# Patient Record
Sex: Female | Born: 1960 | Race: White | Hispanic: No | Marital: Married | Smoking: Never smoker
Health system: Southern US, Community
[De-identification: ages and names within clinical notes are randomized; demographics above are authoritative.]

## PROBLEM LIST (undated history)

## (undated) DIAGNOSIS — E079 Disorder of thyroid, unspecified: Secondary | ICD-10-CM

## (undated) DIAGNOSIS — F329 Major depressive disorder, single episode, unspecified: Secondary | ICD-10-CM

## (undated) DIAGNOSIS — F32A Depression, unspecified: Secondary | ICD-10-CM

---

## 1998-02-01 ENCOUNTER — Other Ambulatory Visit: Admission: RE | Admit: 1998-02-01 | Discharge: 1998-02-01 | Payer: Self-pay | Admitting: Obstetrics and Gynecology

## 1998-02-09 ENCOUNTER — Other Ambulatory Visit: Admission: RE | Admit: 1998-02-09 | Discharge: 1998-02-09 | Payer: Self-pay | Admitting: Obstetrics and Gynecology

## 1998-03-15 ENCOUNTER — Other Ambulatory Visit: Admission: RE | Admit: 1998-03-15 | Discharge: 1998-03-15 | Payer: Self-pay | Admitting: Obstetrics and Gynecology

## 1998-08-17 ENCOUNTER — Inpatient Hospital Stay (HOSPITAL_COMMUNITY): Admission: AD | Admit: 1998-08-17 | Discharge: 1998-08-19 | Payer: Self-pay | Admitting: Obstetrics and Gynecology

## 1998-09-28 ENCOUNTER — Other Ambulatory Visit: Admission: RE | Admit: 1998-09-28 | Discharge: 1998-09-28 | Payer: Self-pay | Admitting: Obstetrics and Gynecology

## 1999-10-28 ENCOUNTER — Other Ambulatory Visit: Admission: RE | Admit: 1999-10-28 | Discharge: 1999-10-28 | Payer: Self-pay | Admitting: Obstetrics and Gynecology

## 2004-03-01 ENCOUNTER — Other Ambulatory Visit: Admission: RE | Admit: 2004-03-01 | Discharge: 2004-03-01 | Payer: Self-pay | Admitting: Obstetrics and Gynecology

## 2004-08-18 HISTORY — PX: BREAST EXCISIONAL BIOPSY: SUR124

## 2005-03-17 ENCOUNTER — Other Ambulatory Visit: Admission: RE | Admit: 2005-03-17 | Discharge: 2005-03-17 | Payer: Self-pay | Admitting: Obstetrics and Gynecology

## 2005-12-20 ENCOUNTER — Emergency Department: Payer: Self-pay | Admitting: Unknown Physician Specialty

## 2005-12-20 ENCOUNTER — Other Ambulatory Visit: Payer: Self-pay

## 2006-03-03 ENCOUNTER — Encounter: Admission: RE | Admit: 2006-03-03 | Discharge: 2006-03-03 | Payer: Self-pay | Admitting: Obstetrics and Gynecology

## 2006-04-16 ENCOUNTER — Other Ambulatory Visit: Admission: RE | Admit: 2006-04-16 | Discharge: 2006-04-16 | Payer: Self-pay | Admitting: Obstetrics and Gynecology

## 2006-05-04 ENCOUNTER — Encounter (INDEPENDENT_AMBULATORY_CARE_PROVIDER_SITE_OTHER): Payer: Self-pay | Admitting: *Deleted

## 2006-05-04 ENCOUNTER — Ambulatory Visit (HOSPITAL_BASED_OUTPATIENT_CLINIC_OR_DEPARTMENT_OTHER): Admission: RE | Admit: 2006-05-04 | Discharge: 2006-05-04 | Payer: Self-pay | Admitting: Surgery

## 2006-06-07 ENCOUNTER — Other Ambulatory Visit: Payer: Self-pay

## 2006-06-07 ENCOUNTER — Emergency Department: Payer: Self-pay | Admitting: Emergency Medicine

## 2006-06-12 ENCOUNTER — Ambulatory Visit: Payer: Self-pay | Admitting: Family Medicine

## 2006-07-03 ENCOUNTER — Other Ambulatory Visit: Payer: Self-pay

## 2006-07-03 ENCOUNTER — Ambulatory Visit: Payer: Self-pay | Admitting: General Surgery

## 2007-04-23 ENCOUNTER — Other Ambulatory Visit: Admission: RE | Admit: 2007-04-23 | Discharge: 2007-04-23 | Payer: Self-pay | Admitting: Obstetrics and Gynecology

## 2007-04-28 ENCOUNTER — Encounter: Admission: RE | Admit: 2007-04-28 | Discharge: 2007-04-28 | Payer: Self-pay | Admitting: Obstetrics and Gynecology

## 2007-10-30 IMAGING — CR DG CHEST 1V PORT
1 series · 1 of 1 positions shown · non-contrast
Comparison: none

REASON FOR EXAM: Chest Pain
COMMENTS:  LMP: Now

PROCEDURE:     DXR - DXR PORTABLE CHEST SINGLE VIEW  - June 07, 2006  [DATE]
RESULT:     AP view of the chest shows the lung fields to be clear. The
heart, mediastinal and osseous structures reveal no significant
abnormalities.

[view not recorded]
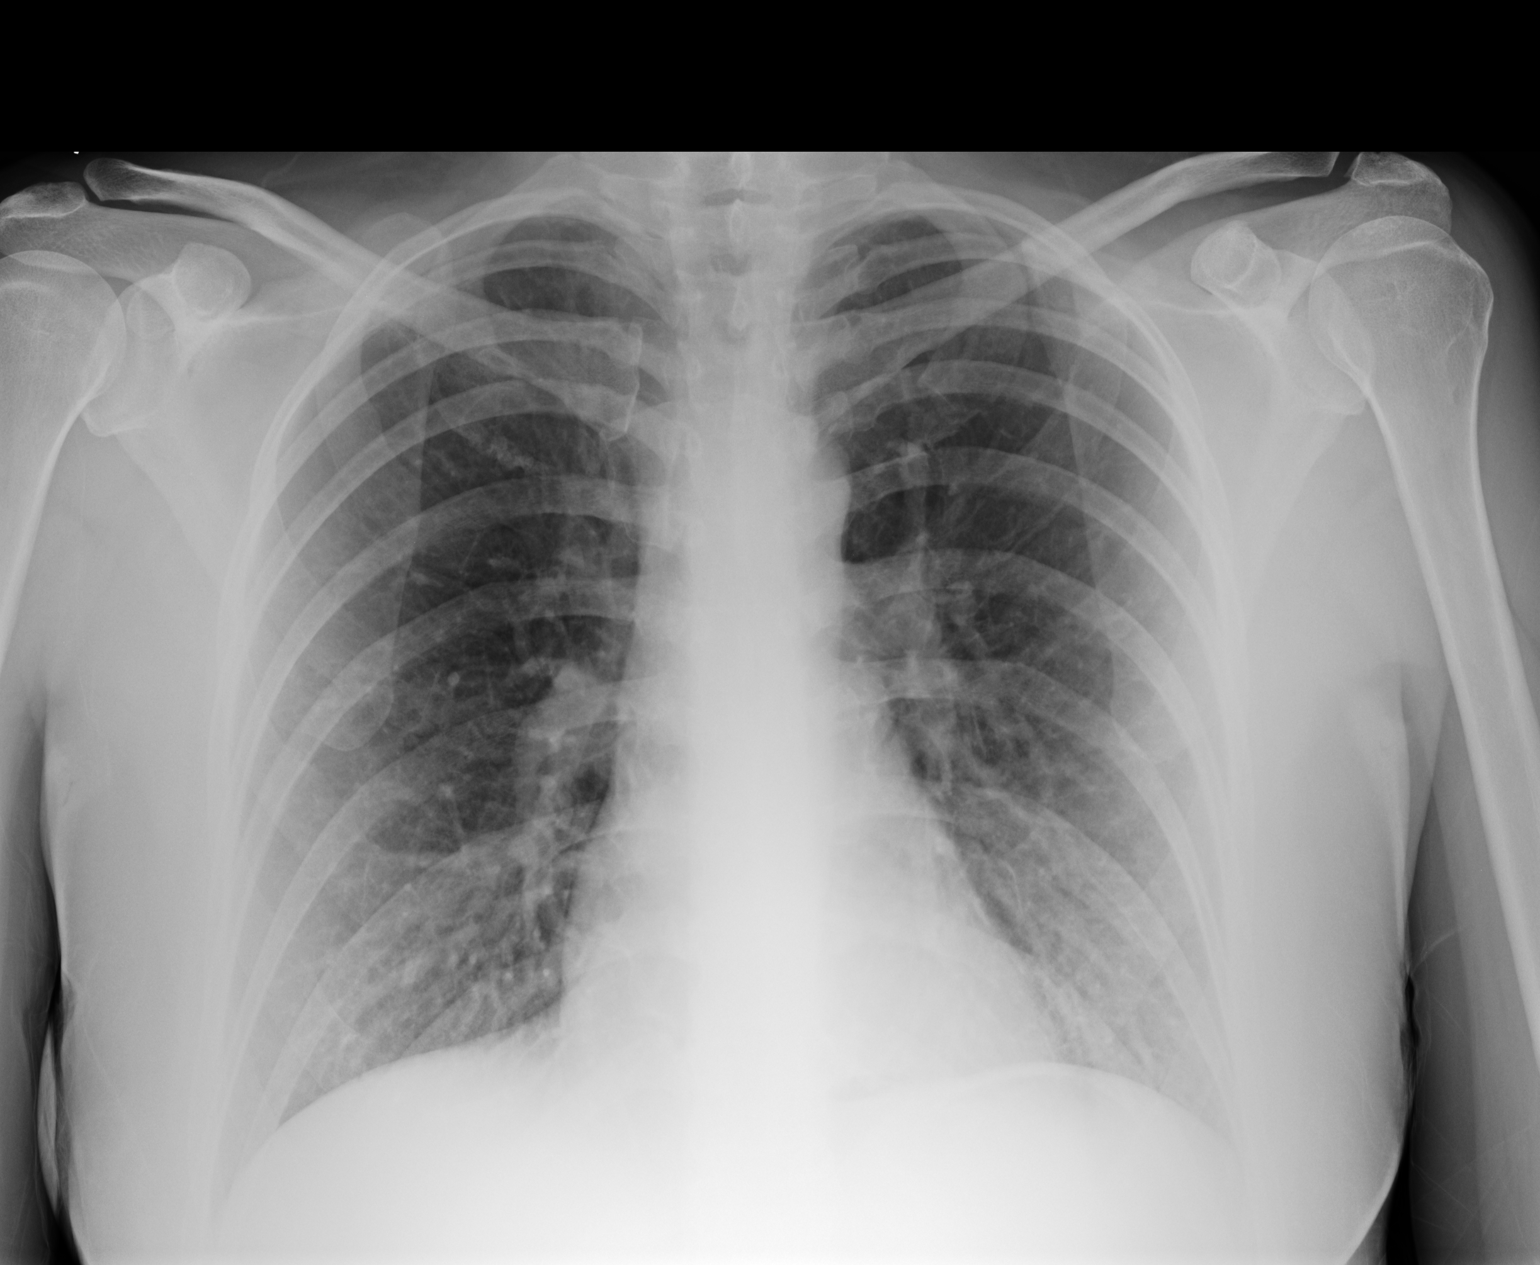

[1 of 1 positions shown; findings below may reference images not displayed]

IMPRESSION: 1)No acute changes are identified.

## 2007-11-04 IMAGING — US ABDOMEN ULTRASOUND
1 series · 17 of 25 positions shown · non-contrast
Comparison: none

REASON FOR EXAM: Abdominal pain
COMMENTS:

[Series 1: abdomen ultrasound · 17 of 53 slices shown]
[im 1/53]
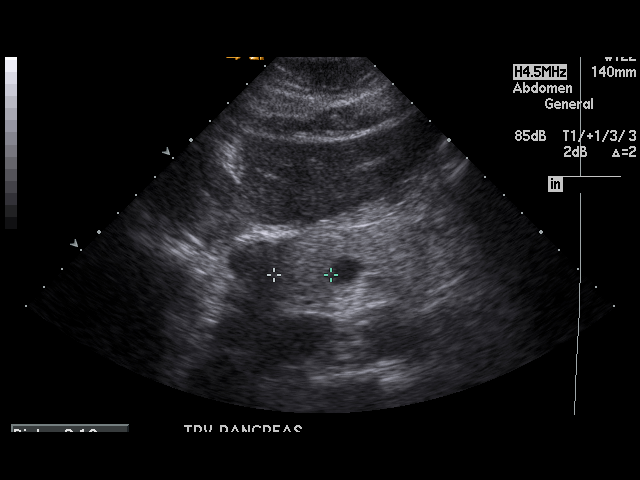
[im 5/53]
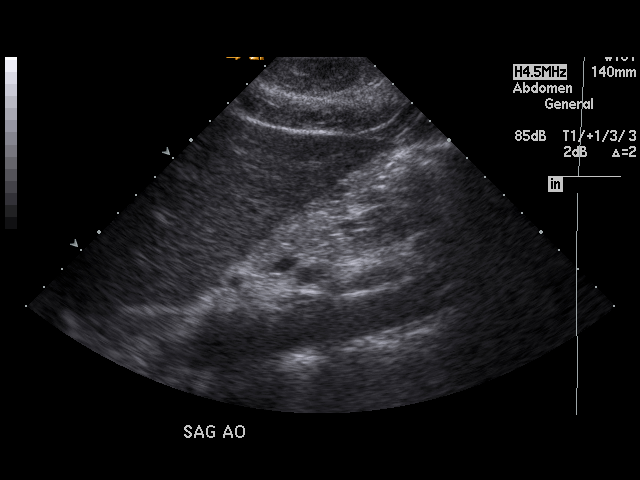
[im 7/53]
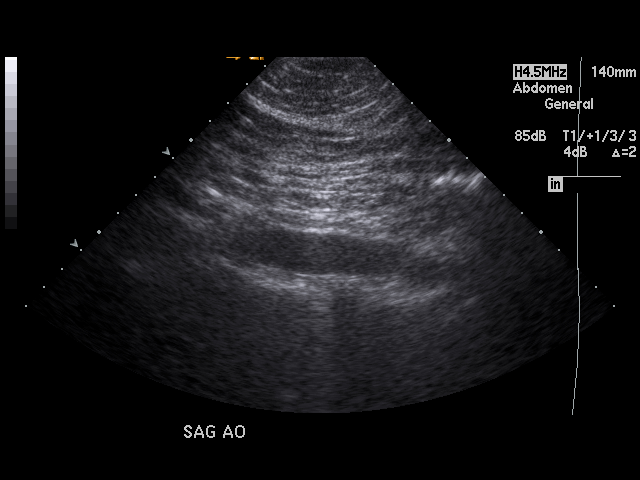
[im 11/53]
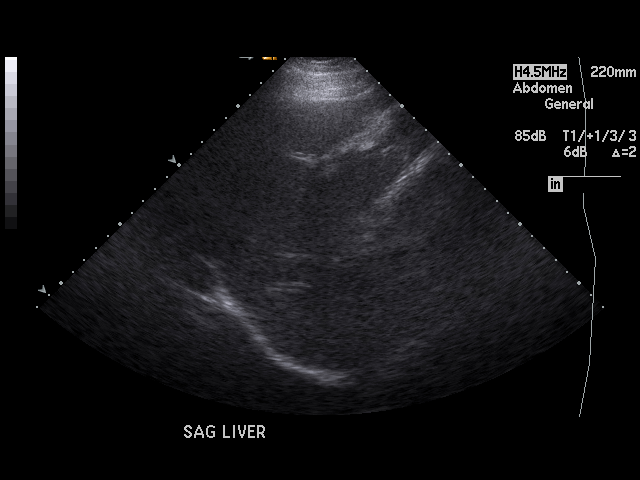
[im 14/53]
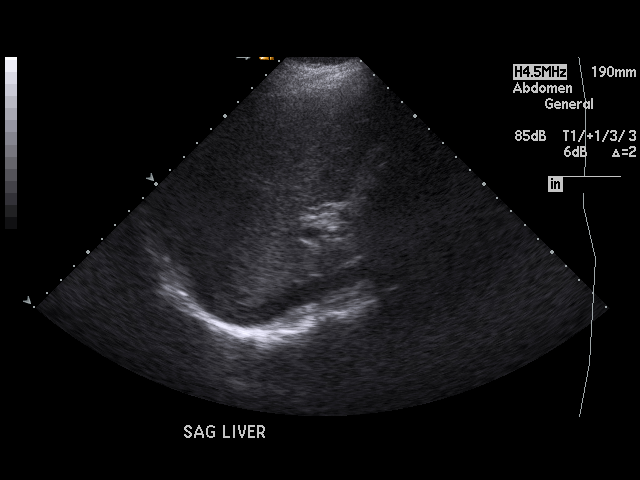
[im 18/53]
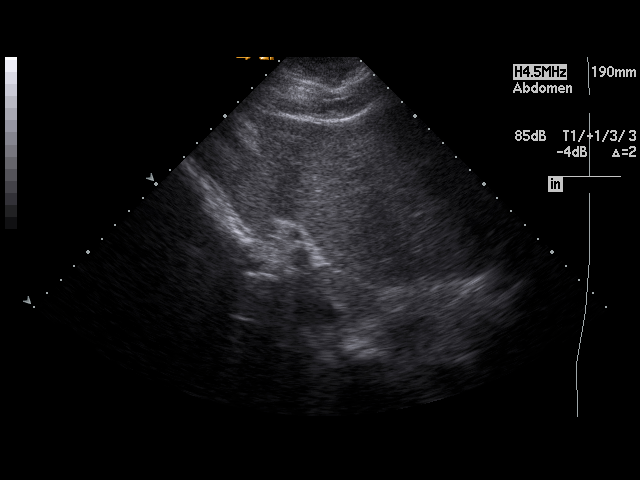
[im 20/53]
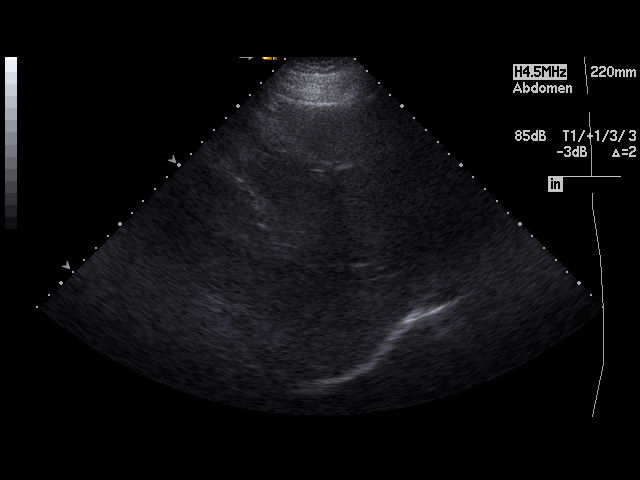
[im 24/53]
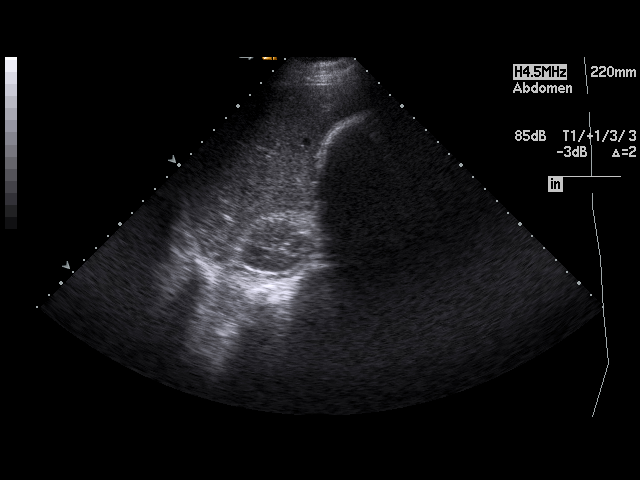
[im 27/53]
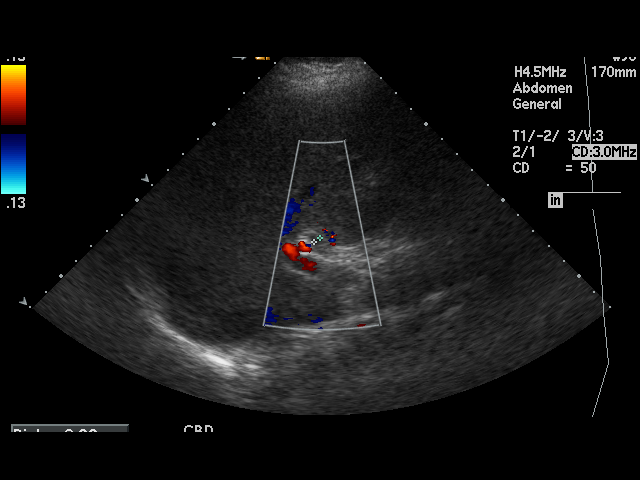
[im 29/53]
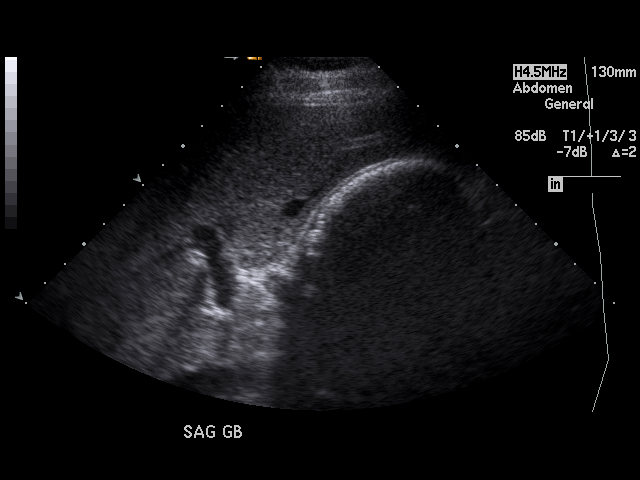
[im 33/53]
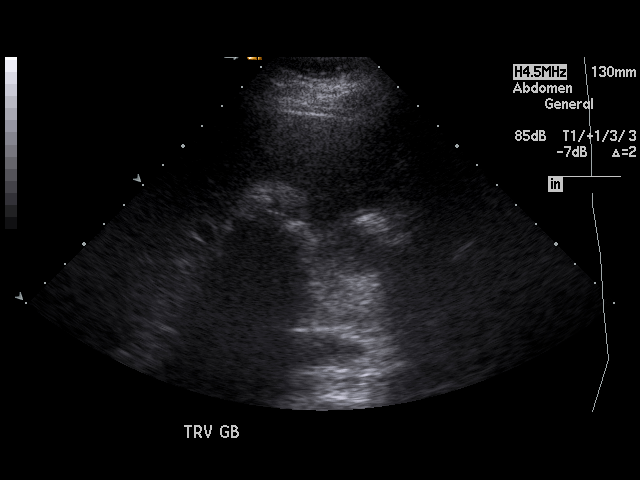
[im 35/53]
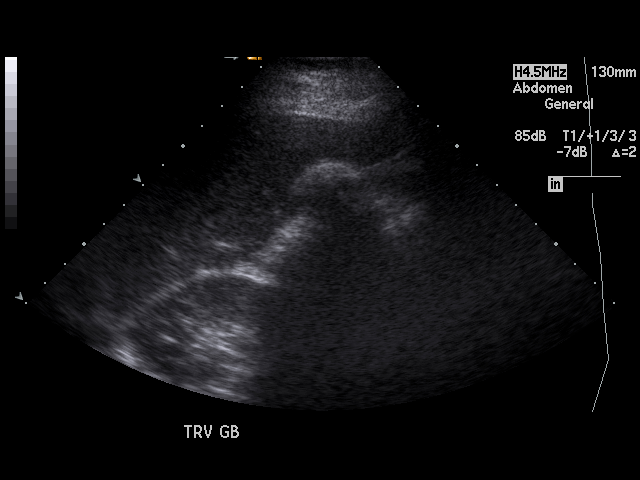
[im 40/53]
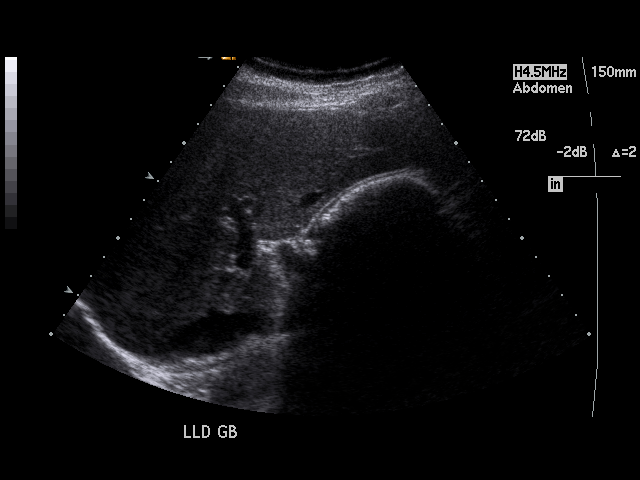
[im 42/53]
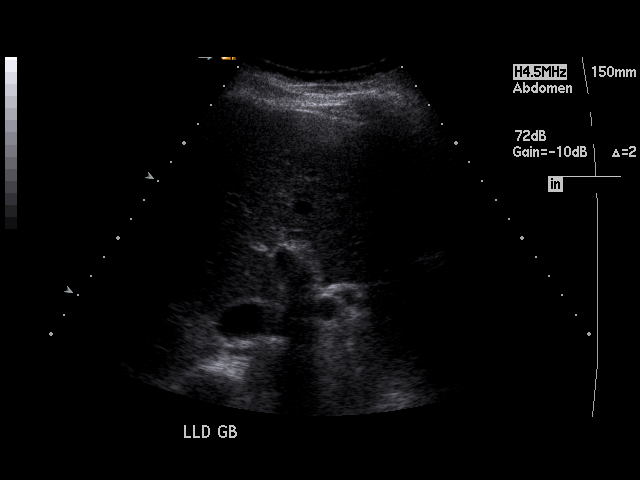
[im 46/53]
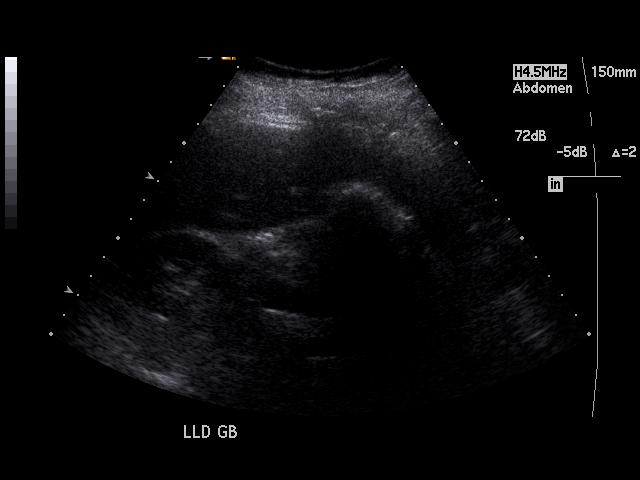
[im 48/53]
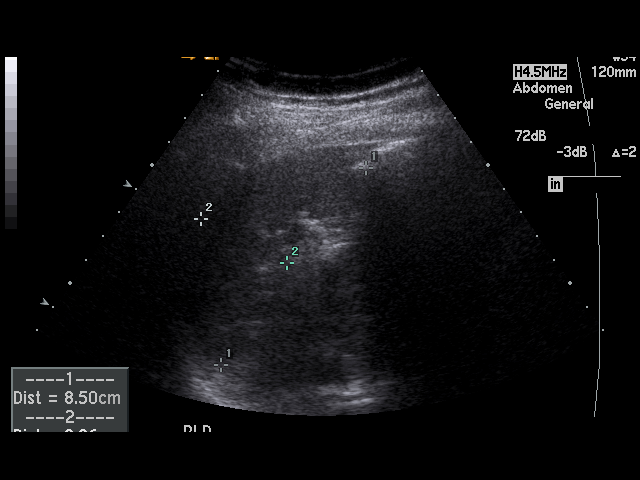
[im 53/53]
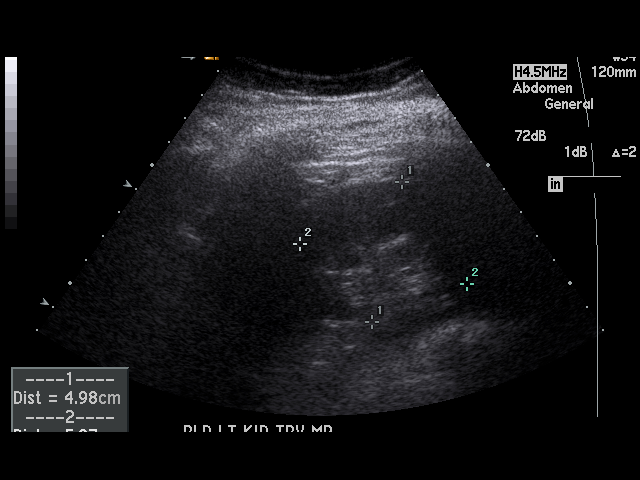

[17 of 25 positions shown; findings below may reference images not displayed]

PROCEDURE:     US  - US ABDOMEN GENERAL SURVEY  - June 12, 2006  [DATE]

RESULT:     The liver, spleen, pancreas and abdominal aorta are normal in
appearance. There are noted echo densities in the gallbladder compatible
with gallstones. No definite thickening of the gallbladder wall is seen. The
common bile duct measures 3.3 mm in diameter which is within normal limits.
The kidneys show no hydronephrosis. There is no ascites.
IMPRESSION: Cholelithiasis.

## 2008-05-15 ENCOUNTER — Other Ambulatory Visit: Admission: RE | Admit: 2008-05-15 | Discharge: 2008-05-15 | Payer: Self-pay | Admitting: Obstetrics and Gynecology

## 2008-05-23 ENCOUNTER — Encounter: Admission: RE | Admit: 2008-05-23 | Discharge: 2008-05-23 | Payer: Self-pay | Admitting: Obstetrics and Gynecology

## 2009-05-16 ENCOUNTER — Other Ambulatory Visit: Admission: RE | Admit: 2009-05-16 | Discharge: 2009-05-16 | Payer: Self-pay | Admitting: Obstetrics and Gynecology

## 2009-07-04 ENCOUNTER — Encounter: Admission: RE | Admit: 2009-07-04 | Discharge: 2009-07-04 | Payer: Self-pay | Admitting: Obstetrics and Gynecology

## 2010-09-11 ENCOUNTER — Ambulatory Visit: Payer: Self-pay | Admitting: Internal Medicine

## 2010-09-16 ENCOUNTER — Ambulatory Visit: Payer: Self-pay | Admitting: Internal Medicine

## 2010-09-18 ENCOUNTER — Ambulatory Visit: Payer: Self-pay | Admitting: Internal Medicine

## 2010-10-17 ENCOUNTER — Ambulatory Visit: Payer: Self-pay | Admitting: Internal Medicine

## 2010-11-17 ENCOUNTER — Ambulatory Visit: Payer: Self-pay | Admitting: Internal Medicine

## 2010-12-17 ENCOUNTER — Ambulatory Visit: Payer: Self-pay | Admitting: Internal Medicine

## 2011-01-03 NOTE — Op Note (Signed)
[name], Cheryl Roach                  ACCOUNT NO.:  [id number]   MEDICAL RECORD NO.:  [id number]          PATIENT TYPE:  AMB   LOCATION:  DSC                          FACILITY:  [facility]   PHYSICIAN:  Currie Paris, M.D.DATE OF BIRTH:  [birth year]   DATE OF PROCEDURE:  05/04/2006  DATE OF DISCHARGE:                                 OPERATIVE REPORT   CCS#:  [id number].   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   OPERATION:  Excision ductal system, left breast.   SURGEON:  Currie Paris, M.D.   ANESTHESIA:  General.   CLINICAL HISTORY:  [name] is a 50 year old lady whose got a spontaneous  originally bloody now clear discharge from the left nipple. A ductogram  showed what appeared to be filling defect centrally located.  The duct  appeared to be very central in orientation.   DESCRIPTION OF PROCEDURE:  The patient was seen in the holding area and she  had no further questions.  We both identified and marked the left side as  the operative side.   The patient was taken to the operating room and after satisfactory  anesthesia with LMA, the left breast was prepped and draped.  The time-out  occurred.   I initially began by identifying the duct and cannulating it with a tear  duct probe starting with a 0-0, then a 0, then a 1 and finally a 2. Once I  did that, I was able to inject a little methylene blue into the ductal  system.   The duct seemed to be tracking a little bit more towards the lower outer  quadrants and I made a circumareolar incision at that area.  I elevated the  skin over to the duct and saw as I transected a small blue stained milk  duct.  I then took out tissue around this duct and there appeared to be what  looked like some papillomas material present.  This was all done with  cautery.  Upon doing this, I saw a little bit more papillomatous  material  which I think I was right at the edge of and then still a little spontaneous  clear discharge coming from the ductal system a little bit deeper basically  straight posterior from the nipple.  I went ahead and put a suture in there  for traction and then excised that area down to fatty tissue through the  entire length of the breast.   I then spent several minutes making sure everything was dry.  I saw and felt  no other abnormalities.   I injected some 0.25% plain Marcaine to help with postop analgesia.  I then  closed this with 3-0 Vicryl to try to reapproximate some of the deeper  tissues, 3-0 Vicryl subcu and 4-0 Monocryl subcuticular plus Dermabond.   The patient tolerated the procedure well.  There were no operative  complications and all counts were correct.      Currie Paris, M.D.  Electronically Signed  CJS/MEDQ  D:  05/04/2006  T:  05/05/2006  Job:  [id number]   cc:   Artist Pais, M.D.  Jerl Mina

## 2011-01-17 ENCOUNTER — Ambulatory Visit: Payer: Self-pay | Admitting: Internal Medicine

## 2011-03-03 ENCOUNTER — Ambulatory Visit: Payer: Self-pay | Admitting: Internal Medicine

## 2011-03-19 ENCOUNTER — Ambulatory Visit: Payer: Self-pay | Admitting: Internal Medicine

## 2011-06-02 ENCOUNTER — Ambulatory Visit: Payer: Self-pay | Admitting: Internal Medicine

## 2011-06-19 ENCOUNTER — Ambulatory Visit: Payer: Self-pay | Admitting: Internal Medicine

## 2011-09-01 ENCOUNTER — Ambulatory Visit: Payer: Self-pay | Admitting: Internal Medicine

## 2011-09-01 LAB — CBC CANCER CENTER
Basophil %: 0.3 %
Eosinophil %: 1.9 %
HCT: 37.4 % (ref 35.0–47.0)
HGB: 12.7 g/dL (ref 12.0–16.0)
Lymphocyte %: 32.3 %
Neutrophil %: 60.2 %
Platelet: 425 x10 3/mm (ref 150–440)
RBC: 4.02 10*6/uL (ref 3.80–5.20)
WBC: 8 x10 3/mm (ref 3.6–11.0)

## 2011-09-19 ENCOUNTER — Ambulatory Visit: Payer: Self-pay | Admitting: Internal Medicine

## 2011-12-05 ENCOUNTER — Ambulatory Visit: Payer: Self-pay | Admitting: Internal Medicine

## 2011-12-05 LAB — CBC CANCER CENTER
Basophil %: 0.8 %
Eosinophil #: 0.2 x10 3/mm (ref 0.0–0.7)
HGB: 11.7 g/dL — ABNORMAL LOW (ref 12.0–16.0)
Lymphocyte %: 31.1 %
MCH: 31.1 pg (ref 26.0–34.0)
MCV: 94 fL (ref 80–100)
Monocyte %: 6.3 %
Neutrophil #: 5.6 x10 3/mm (ref 1.4–6.5)
Neutrophil %: 59.7 %
Platelet: 420 x10 3/mm (ref 150–440)
WBC: 9.4 x10 3/mm (ref 3.6–11.0)

## 2011-12-17 ENCOUNTER — Ambulatory Visit: Payer: Self-pay | Admitting: Internal Medicine

## 2012-06-08 ENCOUNTER — Ambulatory Visit: Payer: Self-pay | Admitting: Cardiology

## 2012-08-18 ENCOUNTER — Ambulatory Visit: Payer: Self-pay | Admitting: Internal Medicine

## 2012-09-16 LAB — CBC CANCER CENTER
Basophil %: 0.8 %
Eosinophil #: 0.1 x10 3/mm (ref 0.0–0.7)
MCH: 31 pg (ref 26.0–34.0)
MCV: 92 fL (ref 80–100)
Neutrophil %: 54.8 %
Platelet: 428 x10 3/mm (ref 150–440)
RBC: 3.93 10*6/uL (ref 3.80–5.20)
WBC: 8.6 x10 3/mm (ref 3.6–11.0)

## 2012-09-16 LAB — IRON AND TIBC
Iron Bind.Cap.(Total): 370 ug/dL (ref 250–450)
Iron Saturation: 37 %
Unbound Iron-Bind.Cap.: 234 ug/dL

## 2012-09-16 LAB — FERRITIN: Ferritin (ARMC): 96 ng/mL (ref 8–388)

## 2012-09-18 ENCOUNTER — Ambulatory Visit: Payer: Self-pay | Admitting: Internal Medicine

## 2012-11-16 ENCOUNTER — Ambulatory Visit: Payer: Self-pay | Admitting: Internal Medicine

## 2012-12-02 LAB — CBC CANCER CENTER
Basophil #: 0 x10 3/mm (ref 0.0–0.1)
Eosinophil #: 0.2 x10 3/mm (ref 0.0–0.7)
Eosinophil %: 2 %
HCT: 36.5 % (ref 35.0–47.0)
HGB: 12.1 g/dL (ref 12.0–16.0)
Lymphocyte #: 3.3 x10 3/mm (ref 1.0–3.6)
Lymphocyte %: 33.2 %
MCH: 30.6 pg (ref 26.0–34.0)
Monocyte #: 0.6 x10 3/mm (ref 0.2–0.9)
Neutrophil %: 58.1 %
Platelet: 430 x10 3/mm (ref 150–440)
RBC: 3.95 10*6/uL (ref 3.80–5.20)
RDW: 12.7 % (ref 11.5–14.5)

## 2012-12-16 ENCOUNTER — Ambulatory Visit: Payer: Self-pay | Admitting: Internal Medicine

## 2013-01-28 ENCOUNTER — Ambulatory Visit: Payer: Self-pay | Admitting: Gastroenterology

## 2013-03-18 ENCOUNTER — Ambulatory Visit: Payer: Self-pay | Admitting: Internal Medicine

## 2017-10-10 ENCOUNTER — Other Ambulatory Visit: Payer: Self-pay

## 2017-10-10 ENCOUNTER — Encounter: Payer: Self-pay | Admitting: Emergency Medicine

## 2017-10-10 ENCOUNTER — Emergency Department
Admission: EM | Admit: 2017-10-10 | Discharge: 2017-10-10 | Disposition: A | Discharge status: Home / Self Care | Payer: BLUE CROSS/BLUE SHIELD | Attending: Emergency Medicine | Admitting: Emergency Medicine

## 2017-10-10 DIAGNOSIS — Y929 Unspecified place or not applicable: Secondary | ICD-10-CM | POA: Diagnosis not present

## 2017-10-10 DIAGNOSIS — Y939 Activity, unspecified: Secondary | ICD-10-CM | POA: Diagnosis not present

## 2017-10-10 DIAGNOSIS — W010XXA Fall on same level from slipping, tripping and stumbling without subsequent striking against object, initial encounter: Secondary | ICD-10-CM | POA: Diagnosis not present

## 2017-10-10 DIAGNOSIS — Z23 Encounter for immunization: Secondary | ICD-10-CM | POA: Insufficient documentation

## 2017-10-10 DIAGNOSIS — Y999 Unspecified external cause status: Secondary | ICD-10-CM | POA: Insufficient documentation

## 2017-10-10 DIAGNOSIS — S0181XA Laceration without foreign body of other part of head, initial encounter: Secondary | ICD-10-CM | POA: Diagnosis not present

## 2017-10-10 HISTORY — DX: Depression, unspecified: F32.A

## 2017-10-10 HISTORY — DX: Major depressive disorder, single episode, unspecified: F32.9

## 2017-10-10 HISTORY — DX: Disorder of thyroid, unspecified: E07.9

## 2017-10-10 MED ORDER — LIDOCAINE-EPINEPHRINE-TETRACAINE (LET) SOLUTION
NASAL | Status: AC
  Administered 2017-10-10: 3 mL via TOPICAL
  Filled 2017-10-10: qty 3

## 2017-10-10 MED ORDER — LIDOCAINE-EPINEPHRINE-TETRACAINE (LET) SOLUTION
3.0000 mL | Freq: Once | NASAL | Status: AC
  Administered 2017-10-10: 3 mL via TOPICAL

## 2017-10-10 MED ORDER — TETANUS-DIPHTH-ACELL PERTUSSIS 5-2.5-18.5 LF-MCG/0.5 IM SUSP
0.5000 mL | Freq: Once | INTRAMUSCULAR | Status: AC
  Administered 2017-10-10: 0.5 mL via INTRAMUSCULAR

## 2017-10-10 MED ORDER — LIDOCAINE HCL (PF) 1 % IJ SOLN
5.0000 mL | Freq: Once | INTRAMUSCULAR | Status: AC
  Administered 2017-10-10: 5 mL
  Filled 2017-10-10: qty 5

## 2017-10-10 MED ORDER — TETANUS-DIPHTH-ACELL PERTUSSIS 5-2.5-18.5 LF-MCG/0.5 IM SUSP
INTRAMUSCULAR | Status: AC
  Administered 2017-10-10: 0.5 mL via INTRAMUSCULAR
  Filled 2017-10-10: qty 0.5

## 2017-10-10 NOTE — ED Provider Notes (Signed)
[facility] Emergency Department Provider Note   ____________________________________________   First MD Initiated Contact with Patient 10/10/17 [phone]     (approximate)  I have reviewed the triage vital signs and the nursing notes.   HISTORY  Chief Complaint Facial Laceration  HPI Cheryl Roach is a 57 y.o. female presents to the emergency department this morning after falling.  Patient states she tripped while taking her dog outside.  She has laceration to her chin.  She denies any head injury or loss of consciousness.  She is also denies any trauma to her teeth.  Patient is uncertain of her last tetanus update.  She denies any injury to her upper or lower extremities and was ambulatory after the accident.  Past Medical History:  Diagnosis Date  . Depression   . Thyroid disease     There are no active problems to display for this patient.   History reviewed. No pertinent surgical history.  Prior to Admission medications   Medication Sig Start Date End Date Taking? Authorizing Provider  levothyroxine (SYNTHROID, LEVOTHROID) 100 MCG tablet Take 100 mcg by mouth daily before breakfast.   Yes [provider]  PARoxetine (PAXIL) 20 MG tablet Take 20 mg by mouth daily.   Yes [provider]    Allergies Patient has no known allergies.  No family history on file.  Social History Social History   Tobacco Use  . Smoking status: Never Smoker  Substance Use Topics  . Alcohol use: Not on file  . Drug use: Not on file    Review of Systems Constitutional: No fever/chills Eyes: No visual changes. ENT: No trauma teeth. Cardiovascular: Denies chest pain. Respiratory: Denies shortness of breath. Gastrointestinal:   No nausea, no vomiting.   Musculoskeletal: Negative for back pain. Skin: Positive for laceration to the chin. Neurological: Negative for headaches, focal weakness or  numbness. ____________________________________________   PHYSICAL EXAM:  VITAL SIGNS: ED Triage Vitals  Enc Vitals Group     BP 10/10/17 0730 122/72     Pulse Rate 10/10/17 0730 75     Resp 10/10/17 0730 18     Temp 10/10/17 0730 98.3 F (36.8 C)     Temp Source 10/10/17 0730 Oral     SpO2 10/10/17 0730 99 %     Weight 10/10/17 0731 190 lb (86.2 kg)     Height 10/10/17 0731 6\' 1"  (1.854 m)     Head Circumference --      Peak Flow --      Pain Score 10/10/17 0731 4     Pain Loc --      Pain Edu? --      Excl. in GC? --    Constitutional: Alert and oriented. Well appearing and in no acute distress. Eyes: Conjunctivae are normal. PERRL. EOMI. Head: Atraumatic. Nose: No congestion/rhinnorhea. Mouth/Throat: Mucous membranes are moist.  Oropharynx non-erythematous.  No trauma noted to the teeth and patient is able to bite on a tongue depressor without pain. Neck: No stridor.  No cervical tenderness on palpation posteriorly. Cardiovascular: Normal rate, regular rhythm. Grossly normal heart sounds.  Good peripheral circulation. Respiratory: Normal respiratory effort.  No retractions. Lungs CTAB. Musculoskeletal: No lower extremity tenderness nor edema.  No joint effusions. Neurologic:  Normal speech and language. No gross focal neurologic deficits are appreciated.  Skin:  Skin is warm, dry.  3 cm laceration to the chin without obvious foreign body present.  No active bleeding present at this time.  Psychiatric: Mood and affect are normal. Speech and behavior are normal.  ____________________________________________   LABS (all labs ordered are listed, but only abnormal results are displayed)  Labs Reviewed - No data to display   PROCEDURES  Procedure(s) performed: LACERATION REPAIR Performed by: Tommi Rumps Authorized by: Tommi Rumps Consent: Verbal consent obtained. Risks and benefits: risks, benefits and alternatives were discussed Consent given by:  patient Patient identity confirmed: provided demographic data Prepped and Draped in normal sterile fashion Wound explored  Laceration Location: Chin  Laceration Length: 3.0 cm  No Foreign Bodies seen or palpated Topical anesthesia: LET  Anesthesia: local infiltration  Local anesthetic: lidocaine 1 % without epinephrine  Anesthetic total: 4 ml  Irrigation method: syringe Amount of cleaning: standard  Skin closure: 6-0 Vicryl and 6-0 Ethilon.  Number of sutures: 2 subcutaneous sutures                                 6 external stitches with 6-0 Ethilon  Technique: Simple interrupted  Patient tolerance: Patient tolerated the procedure well with no immediate complications.  Procedures  Critical Care performed: No  ____________________________________________   INITIAL IMPRESSION / ASSESSMENT AND PLAN / ED COURSE Patient was instructed to use Tylenol if needed for pain.  She was given a list of information on how to take care of her sutured area.  She will follow-up with her PCP, urgent care or return to the ED for suture removal in 5 days.  She is to watch for any signs of infection and be seen earlier if any problems.  ____________________________________________   FINAL CLINICAL IMPRESSION(S) / ED DIAGNOSES  Final diagnoses:  Chin laceration, initial encounter     ED Discharge Orders    None       Note:  This document was prepared using Dragon voice recognition software and may include unintentional dictation errors.    Tommi Rumps, PA-C 10/10/17 1050    Schaevitz, Myra Rude, MD 10/10/17 [phone]

## 2017-10-10 NOTE — ED Notes (Signed)
Pt states she taking the dog outside and tripped and fell. Pt has a lac to the chin with controlled bleeding. Denies LOC, denies HA. Denies any other injuries at this time. Family is at the bedside..Marland Kitchen

## 2017-10-10 NOTE — Discharge Instructions (Signed)
WOUND CARE Please return in 5 days to have your stitches/staples removed or sooner if you have concerns. You may also go to your doctor or urgent care at [facility].  Keep area clean and dry for 24 hours. Do not remove bandage, if applied.  After 24 hours, remove bandage and wash wound gently with mild soap and warm water. Reapply a new bandage after cleaning wound, if directed.  Continue daily cleansing with soap and water until stitches/staples are removed.  Do not apply any ointments or creams to the wound while stitches/staples are in place, as this may cause delayed healing.  Notify the office if you experience any of the following signs of infection: Swelling, redness, pus drainage, streaking, fever >101.0 F  Notify the office if you experience excessive bleeding that does not stop after 15-20 minutes of constant, firm pressure.

## 2017-10-10 NOTE — ED Triage Notes (Signed)
Tripped and fell this am approx 0630. Laceration to chin. No LOC.

## 2019-09-27 ENCOUNTER — Other Ambulatory Visit: Payer: Self-pay

## 2019-09-27 ENCOUNTER — Ambulatory Visit (INDEPENDENT_AMBULATORY_CARE_PROVIDER_SITE_OTHER): Payer: BC Managed Care – PPO | Admitting: Podiatry

## 2019-09-27 DIAGNOSIS — B351 Tinea unguium: Secondary | ICD-10-CM | POA: Diagnosis not present

## 2019-09-28 ENCOUNTER — Encounter: Payer: Self-pay | Admitting: Podiatry

## 2019-09-28 LAB — HEPATIC FUNCTION PANEL
ALT: 32 IU/L (ref 0–32)
AST: 27 IU/L (ref 0–40)
Albumin: 4.2 g/dL (ref 3.8–4.9)
Alkaline Phosphatase: 57 IU/L (ref 39–117)
Bilirubin Total: 1.5 mg/dL — ABNORMAL HIGH (ref 0.0–1.2)
Bilirubin, Direct: 0.36 mg/dL (ref 0.00–0.40)
Total Protein: 6.7 g/dL (ref 6.0–8.5)

## 2019-09-28 MED ORDER — TERBINAFINE HCL 250 MG PO TABS: 250.0000 mg | ORAL_TABLET | Freq: Every day | ORAL | 0 refills | Status: AC

## 2019-09-28 NOTE — Progress Notes (Signed)
  Subjective:  Patient ID: Cheryl Roach, female    DOB: 09-28-1960,  MRN: [id number]  Chief Complaint  Patient presents with  . Nail Problem    pt is here for bil toenail fungus, of both big toenails, left big toenail about 1 year, and the right big toenail, going on for about 1 month.     59 y.o. female presents with the above complaint.  Patient presents with complaint of bilateral hallux as well as right second toe fungal infection.  Patient states that onychomycosis has been going on for about a year and it appears to have progressively gotten worse.  She would like to know if there is anything that could be done for this.  Patient has tried over-the-counter creams but has not helped much.  She denies any other alleviating factors or any other treatment factors.  She denies seeing anyone else for this.   Review of Systems: Negative except as noted in the HPI. Denies N/V/F/Ch.  Past Medical History:  Diagnosis Date  . Depression   . Thyroid disease     Current Outpatient Medications:  .  JUNEL FE 1/20 1-20 MG-MCG tablet, Take 1 tablet by mouth daily., Disp: , Rfl:  .  levothyroxine (SYNTHROID, LEVOTHROID) 100 MCG tablet, Take 100 mcg by mouth daily before breakfast., Disp: , Rfl:  .  PARoxetine (PAXIL) 20 MG tablet, Take 20 mg by mouth daily., Disp: , Rfl:  .  terbinafine (LAMISIL) 250 MG tablet, Take 1 tablet (250 mg total) by mouth daily., Disp: 90 tablet, Rfl: 0  Social History   Tobacco Use  Smoking Status Never Smoker    No Known Allergies Objective:  There were no vitals filed for this visit. There is no height or weight on file to calculate BMI. Constitutional Well developed. Well nourished.  Vascular Dorsalis pedis pulses palpable bilaterally. Posterior tibial pulses palpable bilaterally. Capillary refill normal to all digits.  No cyanosis or clubbing noted. Pedal hair growth normal.  Neurologic Normal speech. Oriented to person, place, and time. Epicritic  sensation to light touch grossly present bilaterally.  Dermatologic  bilateral hallux right and right second toe onychomycosis with elongated thickened mycotic toenails x3.  Orthopedic: Normal joint ROM without pain or crepitus bilaterally. No visible deformities. No bony tenderness.   Radiographs: None Assessment:   1. Nail fungus    Plan:  Patient was evaluated and treated and all questions answered.  Onychomycosis of right second digit as well as bilateral hallux -Educated the patient on the etiology of onychomycosis and various treatment options associated with improving the fungal load.  I explained to the patient that there is 3 treatment options available to treat the onychomycosis including topical, p.o., laser treatment.  Patient elected to undergo p.o. options with Lamisil/terbinafine therapy.  In order for me to start the medication therapy, I explained to the patient the importance of evaluating the liver and obtaining the liver function test.  Once the liver function test comes back normal I will start him on 41-month course of Lamisil therapy.  Patient understood all risk and would like to proceed with Lamisil therapy.  I have asked the patient to immediately stop the Lamisil therapy if she has any reactions to it.  Patient states understanding  No follow-ups on file.

## 2019-10-28 ENCOUNTER — Ambulatory Visit: Payer: Self-pay | Attending: Internal Medicine

## 2019-10-28 DIAGNOSIS — Z23 Encounter for immunization: Secondary | ICD-10-CM

## 2019-10-28 NOTE — Progress Notes (Signed)
   Covid-19 Vaccination Clinic  Name:  Cheryl Roach    MRN: [id number] DOB: Jan 25, 1961  10/28/2019  [name] was observed post Covid-19 immunization for 15 minutes without incident. She was provided with Vaccine Information Sheet and instruction to access the V-Safe system.   [name] was instructed to call 911 with any severe reactions post vaccine: Marland Kitchen Difficulty breathing  . Swelling of face and throat  . A fast heartbeat  . A bad rash all over body  . Dizziness and weakness   Immunizations Administered    Name Date Dose VIS Date Route   Pfizer COVID-19 Vaccine 10/28/2019 10:34 AM 0.3 mL 07/29/2019 Intramuscular   Manufacturer: [employer], [employer]   Lot: [id number]   NDC: [address]-5

## 2019-11-22 ENCOUNTER — Ambulatory Visit: Payer: Self-pay | Attending: Internal Medicine

## 2019-11-22 DIAGNOSIS — Z23 Encounter for immunization: Secondary | ICD-10-CM

## 2019-11-22 NOTE — Progress Notes (Signed)
   Covid-19 Vaccination Clinic  Name:  MARCOS PELOSO    MRN: [id number] DOB: 10-06-1960  11/22/2019  Ms. Spease was observed post Covid-19 immunization for 15 minutes without incident. She was provided with Vaccine Information Sheet and instruction to access the V-Safe system.   Ms. Yazzie was instructed to call 911 with any severe reactions post vaccine: Marland Kitchen Difficulty breathing  . Swelling of face and throat  . A fast heartbeat  . A bad rash all over body  . Dizziness and weakness   Immunizations Administered    Name Date Dose VIS Date Route   Pfizer COVID-19 Vaccine 11/22/2019 10:07 AM 0.3 mL 07/29/2019 Intramuscular   Manufacturer: [employer], [employer]   Lot: [id number]   NDC: [address]-4

## 2020-01-24 ENCOUNTER — Ambulatory Visit: Payer: BC Managed Care – PPO | Admitting: Podiatry

## 2020-01-24 ENCOUNTER — Other Ambulatory Visit: Payer: Self-pay

## 2020-01-24 ENCOUNTER — Encounter: Payer: Self-pay | Admitting: Podiatry

## 2020-01-24 DIAGNOSIS — B351 Tinea unguium: Secondary | ICD-10-CM | POA: Diagnosis not present

## 2020-01-24 DIAGNOSIS — Z79899 Other long term (current) drug therapy: Secondary | ICD-10-CM

## 2020-01-24 NOTE — Progress Notes (Signed)
  Subjective:  Patient ID: Cheryl Roach, female    DOB: 10/13/1960,  MRN: [id number]  Chief Complaint  Patient presents with  . Nail Problem    Lamisil check   "it has improved but my 2nd toe is still dark"    59 y.o. female presents with the above complaint.  Patient presents with a follow-up of bilateral hallux as well as right second digit onychomycosis.  Patient states the bilateral hallux is improved considerably.  There is some residual fungal infection on the right second toe still present.  She states that she did not have any complication with Lamisil therapy she was able to tolerate it.  She denies any other acute complaints.  She would like to know if she should continue treatment.   Review of Systems: Negative except as noted in the HPI. Denies N/V/F/Ch.  Past Medical History:  Diagnosis Date  . Depression   . Thyroid disease     Current Outpatient Medications:  .  JUNEL FE 1/20 1-20 MG-MCG tablet, Take 1 tablet by mouth daily., Disp: , Rfl:  .  levothyroxine (SYNTHROID, LEVOTHROID) 100 MCG tablet, Take 100 mcg by mouth daily before breakfast., Disp: , Rfl:  .  PARoxetine (PAXIL) 20 MG tablet, Take 20 mg by mouth daily., Disp: , Rfl:  .  terbinafine (LAMISIL) 250 MG tablet, Take 1 tablet (250 mg total) by mouth daily., Disp: 90 tablet, Rfl: 0  Social History   Tobacco Use  Smoking Status Never Smoker    No Known Allergies Objective:  There were no vitals filed for this visit. There is no height or weight on file to calculate BMI. Constitutional Well developed. Well nourished.  Vascular Dorsalis pedis pulses palpable bilaterally. Posterior tibial pulses palpable bilaterally. Capillary refill normal to all digits.  No cyanosis or clubbing noted. Pedal hair growth normal.  Neurologic Normal speech. Oriented to person, place, and time. Epicritic sensation to light touch grossly present bilaterally.  Dermatologic  bilateral hallux right and right second toe  onychomycosis with elongated thickened mycotic toenails x3 slowly improving  Orthopedic: Normal joint ROM without pain or crepitus bilaterally. No visible deformities. No bony tenderness.   Radiographs: None Assessment:   1. Encounter for long-term (current) use of medications   2. Onychomycosis due to dermatophyte   3. Nail fungus    Plan:  Patient was evaluated and treated and all questions answered.  Onychomycosis of right second digit as well as bilateral hallux~improving -Given the patient has clinically improved at least in the bilateral hallux, I believe she will benefit from a second course of Lamisil therapy as she is able to tolerate it pretty well.  I will obtain another letter Lamisil prescription after patient gets liver function study.  Once I see the liver function study I will start back on another 12-month course of Lamisil therapy for the right second digit.  Patient states understanding would like to proceed with the Lamisil course for another 3 months. -We will see her back again in 4 months after  No follow-ups on file.

## 2020-01-24 NOTE — Progress Notes (Signed)
hep

## 2020-01-25 LAB — HEPATIC FUNCTION PANEL
ALT: 54 IU/L — ABNORMAL HIGH (ref 0–32)
AST: 32 IU/L (ref 0–40)
Albumin: 4.3 g/dL (ref 3.8–4.9)
Alkaline Phosphatase: 72 IU/L (ref 48–121)
Bilirubin Total: 0.9 mg/dL (ref 0.0–1.2)
Bilirubin, Direct: 0.27 mg/dL (ref 0.00–0.40)
Total Protein: 7 g/dL (ref 6.0–8.5)

## 2020-01-27 ENCOUNTER — Other Ambulatory Visit: Payer: Self-pay | Admitting: Podiatry

## 2020-01-30 NOTE — Telephone Encounter (Signed)
Per Dr. Allena Katz, Liver test is elevated, he will not be prescribing Lamisil until patient follows up with PCP     I called and left voice mail with results and instructions to see PCP and call back if she has any questions.

## 2020-05-29 ENCOUNTER — Ambulatory Visit: Payer: BC Managed Care – PPO | Admitting: Podiatry

## 2021-10-03 ENCOUNTER — Other Ambulatory Visit: Payer: Self-pay | Admitting: Obstetrics and Gynecology

## 2021-10-03 DIAGNOSIS — Z1231 Encounter for screening mammogram for malignant neoplasm of breast: Secondary | ICD-10-CM

## 2021-11-07 ENCOUNTER — Other Ambulatory Visit: Payer: Self-pay

## 2021-11-07 ENCOUNTER — Ambulatory Visit
Admission: RE | Admit: 2021-11-07 | Discharge: 2021-11-07 | Disposition: A | Discharge status: Home / Self Care | Payer: 59 | Source: Ambulatory Visit | Attending: Obstetrics and Gynecology | Admitting: Obstetrics and Gynecology

## 2021-11-07 DIAGNOSIS — Z1231 Encounter for screening mammogram for malignant neoplasm of breast: Secondary | ICD-10-CM | POA: Insufficient documentation

## 2021-11-08 ENCOUNTER — Inpatient Hospital Stay
Admission: RE | Admit: 2021-11-08 | Discharge: 2021-11-08 | Disposition: A | Discharge status: Home / Self Care | Payer: Self-pay | Source: Ambulatory Visit | Attending: *Deleted | Admitting: *Deleted

## 2021-11-08 ENCOUNTER — Other Ambulatory Visit: Payer: Self-pay | Admitting: *Deleted

## 2021-11-08 DIAGNOSIS — Z1231 Encounter for screening mammogram for malignant neoplasm of breast: Secondary | ICD-10-CM

## 2022-08-17 ENCOUNTER — Telehealth: Payer: 59

## 2022-08-17 ENCOUNTER — Telehealth: Payer: 59 | Admitting: Physician Assistant

## 2022-08-17 DIAGNOSIS — B9689 Other specified bacterial agents as the cause of diseases classified elsewhere: Secondary | ICD-10-CM | POA: Diagnosis not present

## 2022-08-17 DIAGNOSIS — R110 Nausea: Secondary | ICD-10-CM

## 2022-08-17 DIAGNOSIS — J208 Acute bronchitis due to other specified organisms: Secondary | ICD-10-CM

## 2022-08-17 MED ORDER — BENZONATATE 100 MG PO CAPS: 100.0000 mg | ORAL_CAPSULE | Freq: Three times a day (TID) | ORAL | 0 refills | Status: AC | PRN

## 2022-08-17 MED ORDER — AZITHROMYCIN 250 MG PO TABS: ORAL_TABLET | ORAL | 0 refills | Status: AC

## 2022-08-17 MED ORDER — ONDANSETRON 4 MG PO TBDP: 4.0000 mg | ORAL_TABLET | Freq: Three times a day (TID) | ORAL | 0 refills | Status: AC | PRN

## 2022-08-17 NOTE — Progress Notes (Signed)
Virtual Visit Consent   Cheryl Roach, you are scheduled for a virtual visit with a [facility] provider today. Just as with appointments in the office, your consent must be obtained to participate. Your consent will be active for this visit and any virtual visit you may have with one of our providers in the next 365 days. If you have a MyChart account, a copy of this consent can be sent to you electronically.  As this is a virtual visit, video technology does not allow for your provider to perform a traditional examination. This may limit your provider's ability to fully assess your condition. If your provider identifies any concerns that need to be evaluated in person or the need to arrange testing (such as labs, EKG, etc.), we will make arrangements to do so. Although advances in technology are sophisticated, we cannot ensure that it will always work on either your end or our end. If the connection with a video visit is poor, the visit may have to be switched to a telephone visit. With either a video or telephone visit, we are not always able to ensure that we have a secure connection.  By engaging in this virtual visit, you consent to the provision of healthcare and authorize for your insurance to be billed (if applicable) for the services provided during this visit. Depending on your insurance coverage, you may receive a charge related to this service.  I need to obtain your verbal consent now. Are you willing to proceed with your visit today? Cheryl Roach has provided verbal consent on 08/17/2022 for a virtual visit (video or telephone). Margaretann Loveless, PA-C  Date: 08/17/2022 11:14 AM  Virtual Visit via Video Note   I, Margaretann Loveless, connected with  Cheryl Roach  ([id number], 1961/02/07) on 08/17/22 at 11:00 AM EST by a video-enabled telemedicine application and verified that I am speaking with the correct person using two identifiers.  Location: Patient: Virtual Visit Location  Patient: Home Provider: Virtual Visit Location Provider: Home Office   I discussed the limitations of evaluation and management by telemedicine and the availability of in person appointments. The patient expressed understanding and agreed to proceed.    History of Present Illness: Cheryl Roach is a 61 y.o. who identifies as a female who was assigned female at birth, and is being seen today for Cough and congestion.  HPI: URI  This is a new problem. The current episode started in the past 7 days (Symptoms started tuesday before christmas, then felt better, then symptoms worsened since last Thursday). The problem has been gradually worsening. The maximum temperature recorded prior to her arrival was 102 - 102.9 F. The fever has been present for 1 to 2 days. Associated symptoms include congestion, coughing (dry), nausea (started last night) and rhinorrhea. Pertinent negatives include no abdominal pain, diarrhea, ear pain, headaches, plugged ear sensation, sinus pain, sore throat or vomiting. Associated symptoms comments: Myalgias in legs. Treatments tried: tylenol, loratadine. The treatment provided no relief.    Covid 19 testing at home was negative  Problems: There are no problems to display for this patient.   Allergies: No Known Allergies Medications:  Current Outpatient Medications:    azithromycin (ZITHROMAX) 250 MG tablet, Take 2 tablets on day 1, then 1 tablet daily on days 2 through 5, Disp: 6 tablet, Rfl: 0   benzonatate (TESSALON) 100 MG capsule, Take 1 capsule (100 mg total) by mouth 3 (three) times daily as needed., Disp: 30  capsule, Rfl: 0   ondansetron (ZOFRAN-ODT) 4 MG disintegrating tablet, Take 1 tablet (4 mg total) by mouth every 8 (eight) hours as needed for nausea or vomiting., Disp: 20 tablet, Rfl: 0   JUNEL FE 1/20 1-20 MG-MCG tablet, Take 1 tablet by mouth daily., Disp: , Rfl:    levothyroxine (SYNTHROID, LEVOTHROID) 100 MCG tablet, Take 100 mcg by mouth daily before  breakfast., Disp: , Rfl:    PARoxetine (PAXIL) 20 MG tablet, Take 20 mg by mouth daily., Disp: , Rfl:    terbinafine (LAMISIL) 250 MG tablet, Take 1 tablet (250 mg total) by mouth daily., Disp: 90 tablet, Rfl: 0  Observations/Objective: Patient is well-developed, well-nourished in no acute distress.  Resting comfortably at home.  Head is normocephalic, atraumatic.  No labored breathing.  Speech is clear and coherent with logical content.  Patient is alert and oriented at baseline.    Assessment and Plan: 1. Acute bacterial bronchitis - azithromycin (ZITHROMAX) 250 MG tablet; Take 2 tablets on day 1, then 1 tablet daily on days 2 through 5  Dispense: 6 tablet; Refill: 0 - benzonatate (TESSALON) 100 MG capsule; Take 1 capsule (100 mg total) by mouth 3 (three) times daily as needed.  Dispense: 30 capsule; Refill: 0  2. Nausea - ondansetron (ZOFRAN-ODT) 4 MG disintegrating tablet; Take 1 tablet (4 mg total) by mouth every 8 (eight) hours as needed for nausea or vomiting.  Dispense: 20 tablet; Refill: 0  - Worsening over a week despite OTC medications - Will treat with Z-pack and tessalon perles - Zofran for nausea - Can add Mucinex  - Push fluids.  - Rest.  - Steam and humidifier can help - Seek in person evaluation if worsening or symptoms fail to improve    Follow Up Instructions: I discussed the assessment and treatment plan with the patient. The patient was provided an opportunity to ask questions and all were answered. The patient agreed with the plan and demonstrated an understanding of the instructions.  A copy of instructions were sent to the patient via MyChart unless otherwise noted below.    The patient was advised to call back or seek an in-person evaluation if the symptoms worsen or if the condition fails to improve as anticipated.  Time:  I spent 10 minutes with the patient via telehealth technology discussing the above problems/concerns.    Margaretann Loveless,  PA-C

## 2022-08-17 NOTE — Patient Instructions (Signed)
Cheryl Roach, thank you for joining Margaretann Loveless, PA-C for today's virtual visit.  While this provider is not your primary care provider (PCP), if your PCP is located in our provider database this encounter information will be shared with them immediately following your visit.   A [facility] MyChart account gives you access to today's visit and all your visits, tests, and labs performed at [facility] " click here if you don't have a [facility] MyChart account or go to [email or web address]  Consent: (Patient) Cheryl Roach provided verbal consent for this virtual visit at the beginning of the encounter.  Current Medications:  Current Outpatient Medications:    azithromycin (ZITHROMAX) 250 MG tablet, Take 2 tablets on day 1, then 1 tablet daily on days 2 through 5, Disp: 6 tablet, Rfl: 0   benzonatate (TESSALON) 100 MG capsule, Take 1 capsule (100 mg total) by mouth 3 (three) times daily as needed., Disp: 30 capsule, Rfl: 0   ondansetron (ZOFRAN-ODT) 4 MG disintegrating tablet, Take 1 tablet (4 mg total) by mouth every 8 (eight) hours as needed for nausea or vomiting., Disp: 20 tablet, Rfl: 0   JUNEL FE 1/20 1-20 MG-MCG tablet, Take 1 tablet by mouth daily., Disp: , Rfl:    levothyroxine (SYNTHROID, LEVOTHROID) 100 MCG tablet, Take 100 mcg by mouth daily before breakfast., Disp: , Rfl:    PARoxetine (PAXIL) 20 MG tablet, Take 20 mg by mouth daily., Disp: , Rfl:    terbinafine (LAMISIL) 250 MG tablet, Take 1 tablet (250 mg total) by mouth daily., Disp: 90 tablet, Rfl: 0   Medications ordered in this encounter:  Meds ordered this encounter  Medications   azithromycin (ZITHROMAX) 250 MG tablet    Sig: Take 2 tablets on day 1, then 1 tablet daily on days 2 through 5    Dispense:  6 tablet    Refill:  0    Order Specific Question:   Supervising Provider    Answer:   Merrilee Jansky [[id number]]   ondansetron (ZOFRAN-ODT) 4 MG disintegrating tablet    Sig: Take 1 tablet  (4 mg total) by mouth every 8 (eight) hours as needed for nausea or vomiting.    Dispense:  20 tablet    Refill:  0    Order Specific Question:   Supervising Provider    Answer:   Merrilee Jansky [[id number]]   benzonatate (TESSALON) 100 MG capsule    Sig: Take 1 capsule (100 mg total) by mouth 3 (three) times daily as needed.    Dispense:  30 capsule    Refill:  0    Order Specific Question:   Supervising Provider    Answer:   Merrilee Jansky [id number]     *If you need refills on other medications prior to your next appointment, please contact your pharmacy*  Follow-Up: Call back or seek an in-person evaluation if the symptoms worsen or if the condition fails to improve as anticipated.  [facility] [phone]  Other Instructions  Acute Bronchitis, Adult  Acute bronchitis is sudden inflammation of the main airways (bronchi) that come off the windpipe (trachea) in the lungs. The swelling causes the airways to get smaller and make more mucus than normal. This can make it hard to breathe and can cause coughing or noisy breathing (wheezing). Acute bronchitis may last several weeks. The cough may last longer. Allergies, asthma, and exposure to smoke may make the condition worse. What are the causes?  This condition can be caused by germs and by substances that irritate the lungs, including: Cold and flu viruses. The most common cause of this condition is the virus that causes the common cold. Bacteria. This is less common. Breathing in substances that irritate the lungs, including: Smoke from cigarettes and other forms of tobacco. Dust and pollen. Fumes from household cleaning products, gases, or burned fuel. Indoor or outdoor air pollution. What increases the risk? The following factors may make you more likely to develop this condition: A weak body's defense system, also called the immune system. A condition that affects your lungs and breathing, such as  asthma. What are the signs or symptoms? Common symptoms of this condition include: Coughing. This may bring up clear, yellow, or green mucus from your lungs (sputum). Wheezing. Runny or stuffy nose. Having too much mucus in your lungs (chest congestion). Shortness of breath. Aches and pains, including sore throat or chest. How is this diagnosed? This condition is usually diagnosed based on: Your symptoms and medical history. A physical exam. You may also have other tests, including tests to rule out other conditions, such as pneumonia. These tests include: A test of lung function. Test of a mucus sample to look for the presence of bacteria. Tests to check the oxygen level in your blood. Blood tests. Chest X-ray. How is this treated? Most cases of acute bronchitis clear up over time without treatment. Your health care provider may recommend: Drinking more fluids to help thin your mucus so it is easier to cough up. Taking inhaled medicine (inhaler) to improve air flow in and out of your lungs. Using a vaporizer or a humidifier. These are machines that add water to the air to help you breathe better. Taking a medicine that thins mucus and clears congestion (expectorant). Taking a medicine that prevents or stops coughing (cough suppressant). It is not common to take an antibiotic medicine for this condition. Follow these instructions at home:  Take over-the-counter and prescription medicines only as told by your health care provider. Use an inhaler, vaporizer, or humidifier as told by your health care provider. Take two teaspoons (10 mL) of honey at bedtime to lessen coughing at night. Drink enough fluid to keep your urine pale yellow. Do not use any products that contain nicotine or tobacco. These products include cigarettes, chewing tobacco, and vaping devices, such as e-cigarettes. If you need help quitting, ask your health care provider. Get plenty of rest. Return to your normal  activities as told by your health care provider. Ask your health care provider what activities are safe for you. Keep all follow-up visits. This is important. How is this prevented? To lower your risk of getting this condition again: Wash your hands often with soap and water for at least 20 seconds. If soap and water are not available, use hand sanitizer. Avoid contact with people who have cold symptoms. Try not to touch your mouth, nose, or eyes with your hands. Avoid breathing in smoke or chemical fumes. Breathing smoke or chemical fumes will make your condition worse. Get the flu shot every year. Contact a health care provider if: Your symptoms do not improve after 2 weeks. You have trouble coughing up the mucus. Your cough keeps you awake at night. You have a fever. Get help right away if you: Cough up blood. Feel pain in your chest. Have severe shortness of breath. Faint or keep feeling like you are going to faint. Have a severe headache. Have a fever  or chills that get worse. These symptoms may represent a serious problem that is an emergency. Do not wait to see if the symptoms will go away. Get medical help right away. Call your local emergency services (911 in the [place] Do not drive yourself to the hospital. Summary Acute bronchitis is inflammation of the main airways (bronchi) that come off the windpipe (trachea) in the lungs. The swelling causes the airways to get smaller and make more mucus than normal. Drinking more fluids can help thin your mucus so it is easier to cough up. Take over-the-counter and prescription medicines only as told by your health care provider. Do not use any products that contain nicotine or tobacco. These products include cigarettes, chewing tobacco, and vaping devices, such as e-cigarettes. If you need help quitting, ask your health care provider. Contact a health care provider if your symptoms do not improve after 2 weeks. This information is not  intended to replace advice given to you by your health care provider. Make sure you discuss any questions you have with your health care provider. Document Revised: 11/14/2021 Document Reviewed: 12/05/2020 Elsevier Patient Education  2023 Elsevier Inc.    If you have been instructed to have an in-person evaluation today at a local Urgent Care facility, please use the link below. It will take you to a list of all of our available [facility], including address, phone number and hours of operation. Please do not delay care.  [facility]  If you or a family member do not have a primary care provider, use the link below to schedule a visit and establish care. When you choose a [facility] primary care physician or advanced practice provider, you gain a long-term partner in health. Find a Primary Care Provider  Learn more about [facility]'s in-office and virtual care options: [facility] - Get Care Now

## 2022-09-30 ENCOUNTER — Other Ambulatory Visit: Payer: Self-pay | Admitting: Obstetrics and Gynecology

## 2022-09-30 DIAGNOSIS — Z1231 Encounter for screening mammogram for malignant neoplasm of breast: Secondary | ICD-10-CM

## 2022-11-12 ENCOUNTER — Encounter: Payer: Self-pay | Admitting: Radiology

## 2022-11-12 ENCOUNTER — Ambulatory Visit
Admission: RE | Admit: 2022-11-12 | Discharge: 2022-11-12 | Disposition: A | Discharge status: Home / Self Care | Payer: 59 | Source: Ambulatory Visit | Attending: Obstetrics and Gynecology | Admitting: Obstetrics and Gynecology

## 2022-11-12 DIAGNOSIS — Z1231 Encounter for screening mammogram for malignant neoplasm of breast: Secondary | ICD-10-CM | POA: Diagnosis not present

## 2023-04-01 IMAGING — MG MM DIGITAL SCREENING BILAT W/ TOMO AND CAD
8 series · 9 of 24 positions shown · non-contrast
Comparison: Previous exam(s).

CLINICAL DATA: Screening.

EXAM:
DIGITAL SCREENING BILATERAL MAMMOGRAM WITH TOMOSYNTHESIS AND CAD
TECHNIQUE: Bilateral screening digital craniocaudal and mediolateral oblique
mammograms were obtained. Bilateral screening digital breast
tomosynthesis was performed. The images were evaluated with
computer-aided detection.

[R MLO synth-2D]
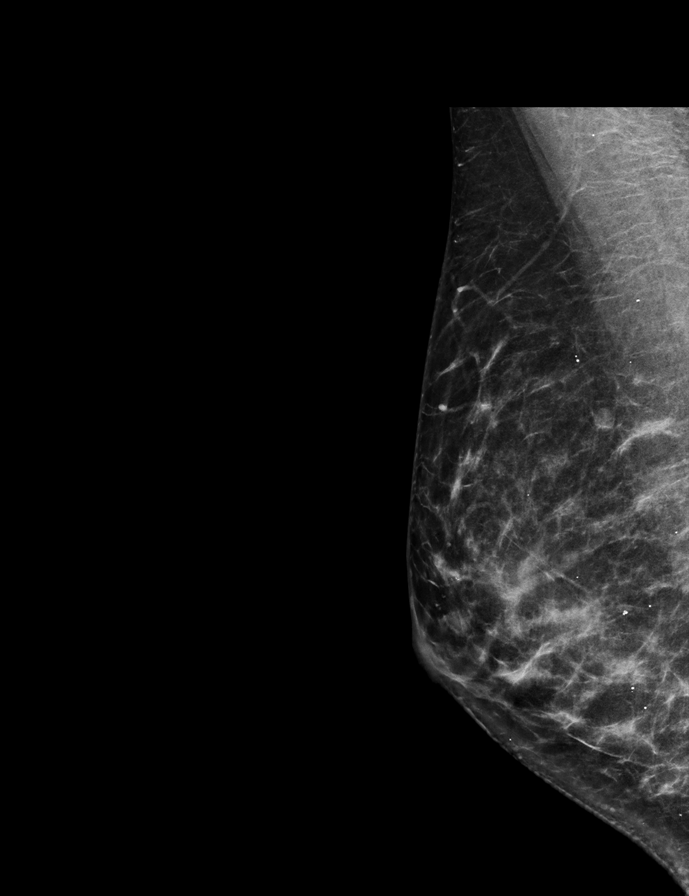

[L MLO synth-2D]
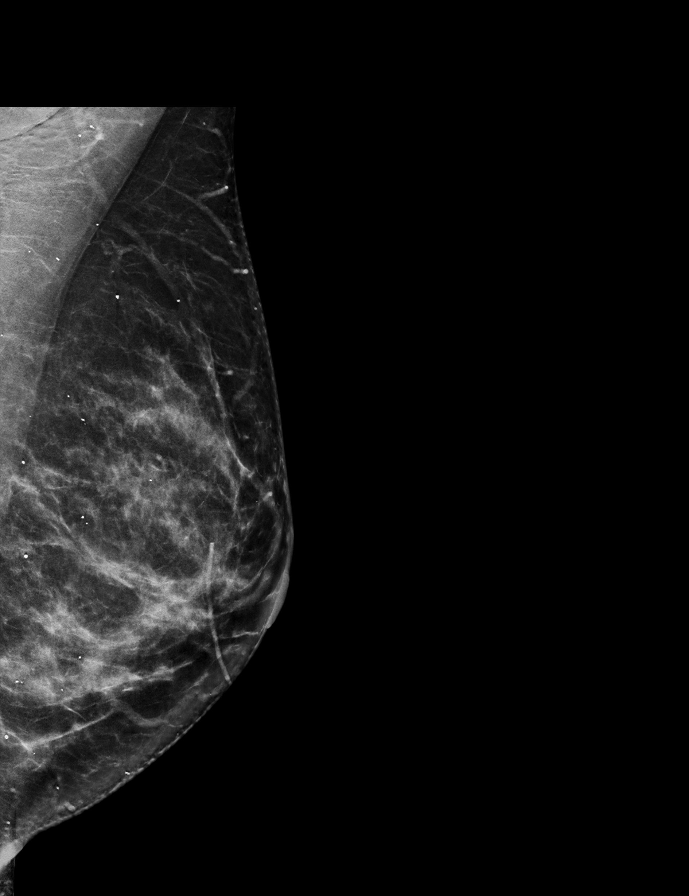

[L CC synth-2D]
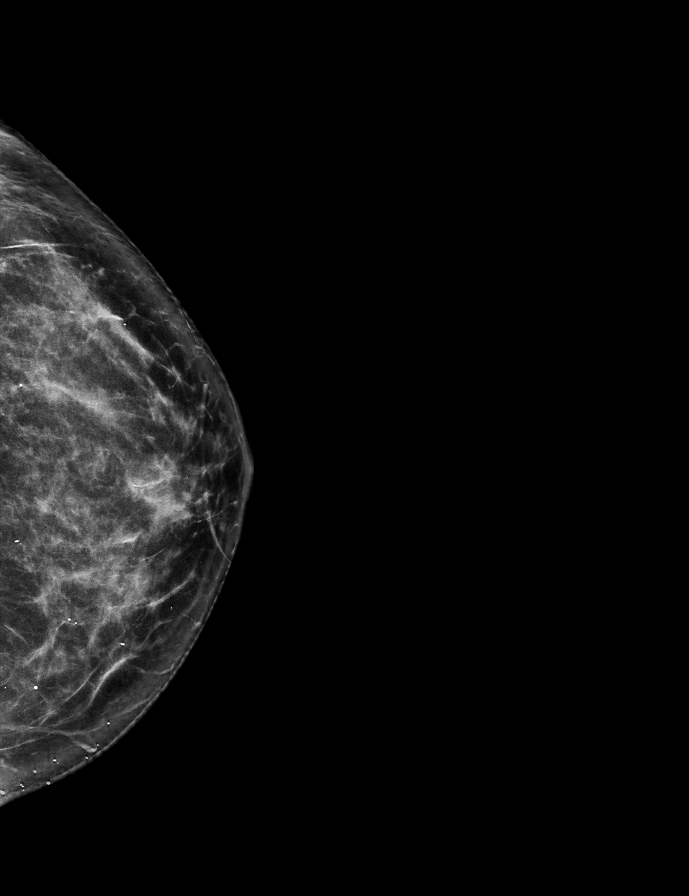

[R CC synth-2D]
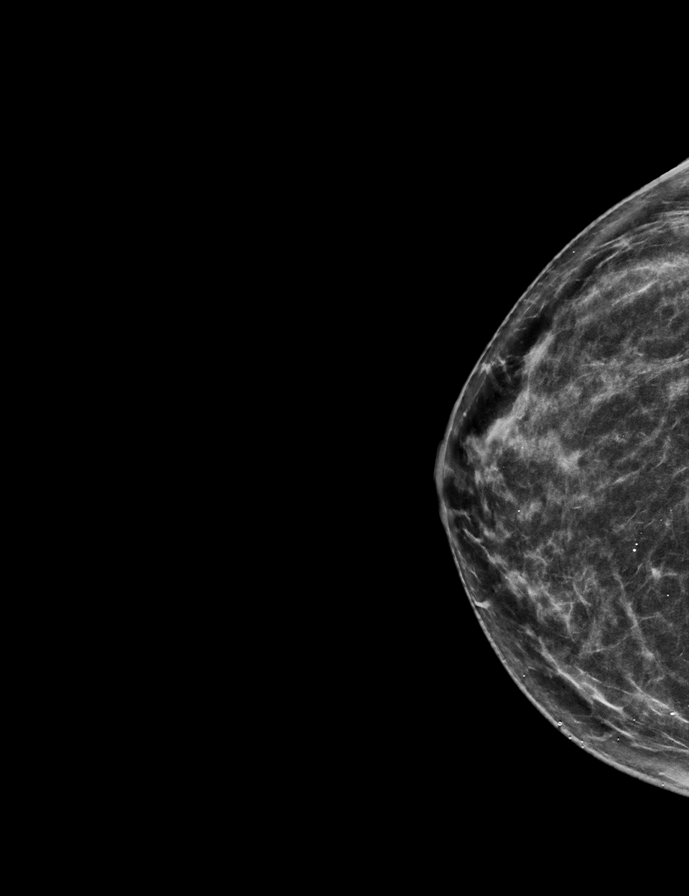

[R MLO tomo · 2 of 66 frames shown]
[frame 22/66]
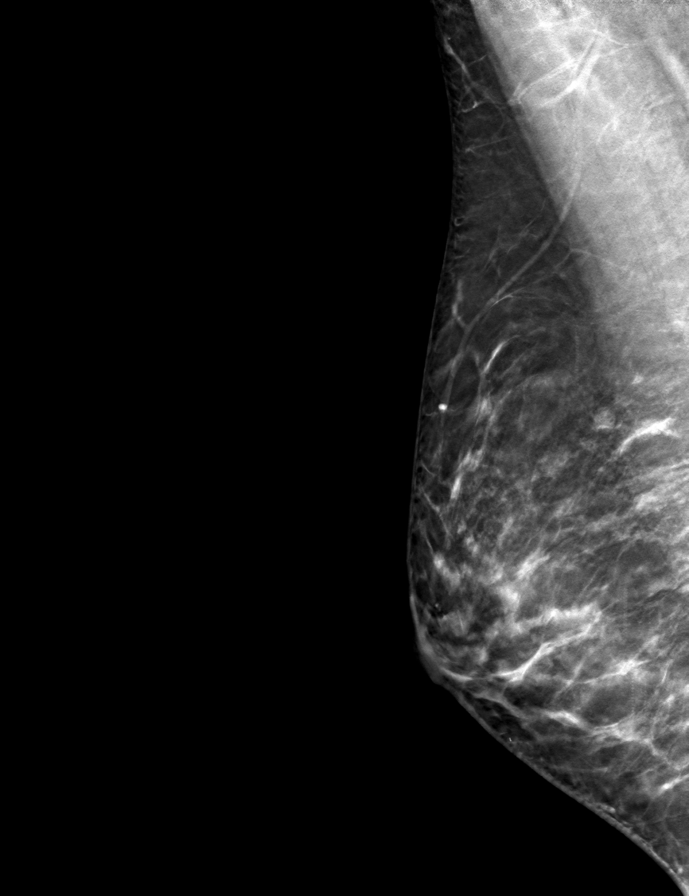
[frame 33/66]
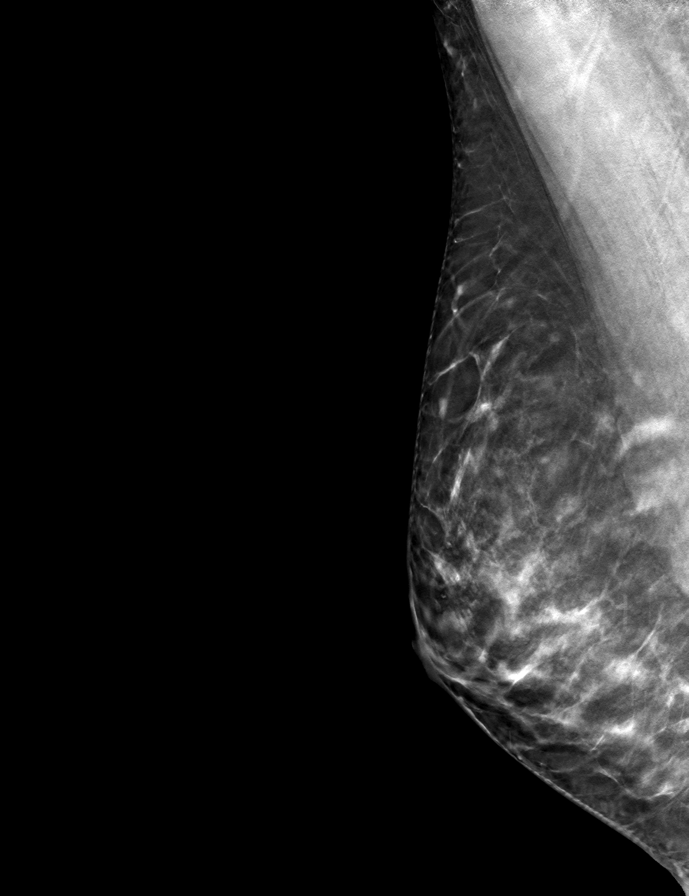

[R CC tomo · tomo slice 34/67.0]
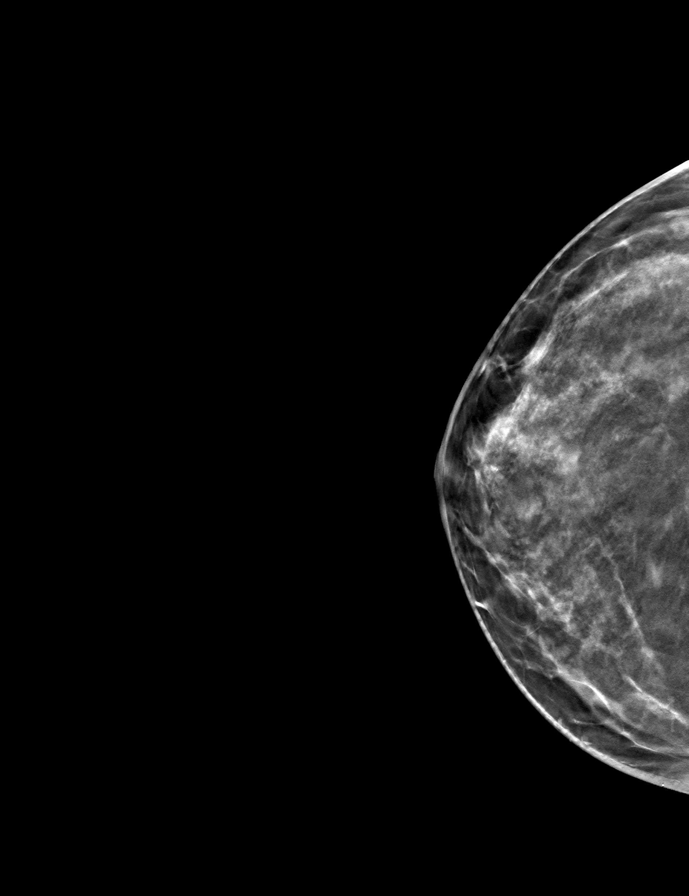

[L MLO tomo · tomo slice 35/70.0]
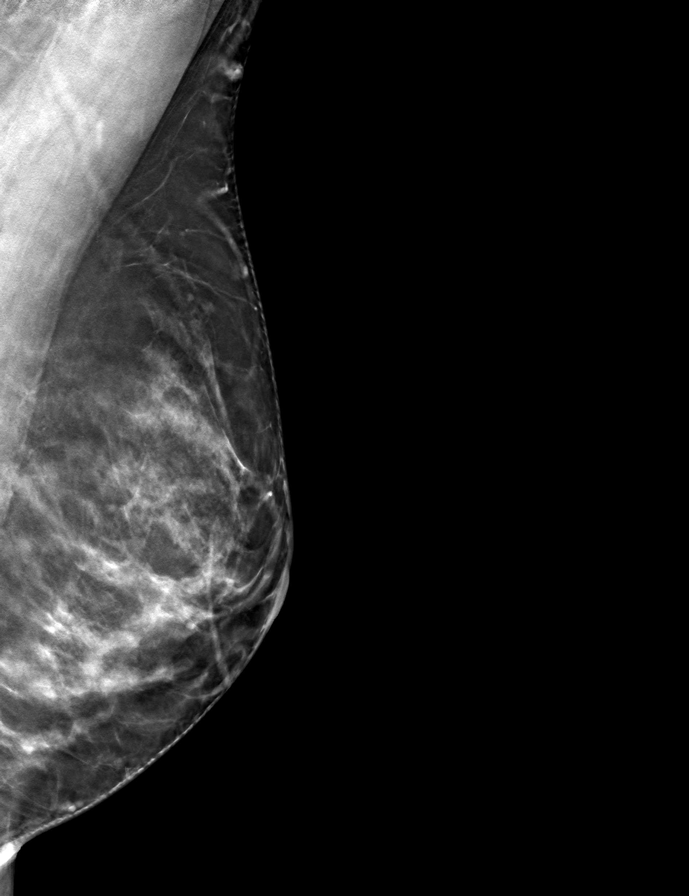

[L CC tomo · tomo slice 36/71.0]
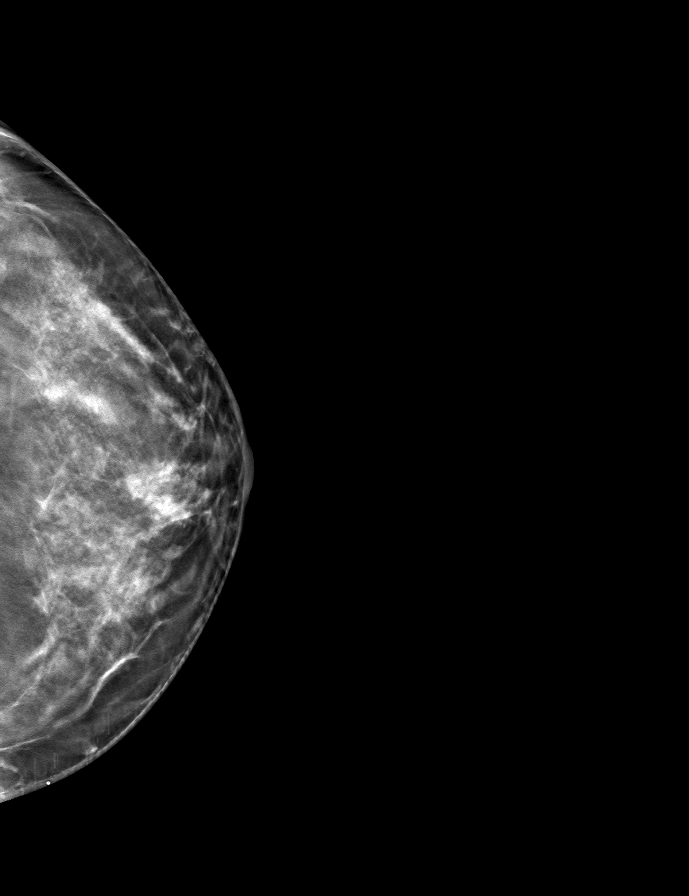

[9 of 24 positions shown; findings below may reference images not displayed]

ACR Breast Density Category c: The breast tissue is heterogeneously
dense, which may obscure small masses.
FINDINGS: There are no findings suspicious for malignancy.
IMPRESSION: No mammographic evidence of malignancy. A result letter of this
screening mammogram will be mailed directly to the patient.

RECOMMENDATION:
Screening mammogram in one year. (Code:Q3-W-BC3)

BI-RADS CATEGORY  1: Negative.

## 2023-09-04 ENCOUNTER — Ambulatory Visit: Payer: 59

## 2023-09-04 DIAGNOSIS — Z09 Encounter for follow-up examination after completed treatment for conditions other than malignant neoplasm: Secondary | ICD-10-CM | POA: Diagnosis present

## 2023-09-04 DIAGNOSIS — Q439 Congenital malformation of intestine, unspecified: Secondary | ICD-10-CM | POA: Diagnosis not present

## 2023-09-04 DIAGNOSIS — K640 First degree hemorrhoids: Secondary | ICD-10-CM | POA: Diagnosis not present

## 2023-09-04 DIAGNOSIS — Z860101 Personal history of adenomatous and serrated colon polyps: Secondary | ICD-10-CM | POA: Diagnosis not present

## 2023-10-01 ENCOUNTER — Other Ambulatory Visit: Payer: Self-pay | Admitting: Obstetrics and Gynecology

## 2023-10-01 DIAGNOSIS — Z1231 Encounter for screening mammogram for malignant neoplasm of breast: Secondary | ICD-10-CM

## 2023-11-26 ENCOUNTER — Ambulatory Visit
Admission: RE | Admit: 2023-11-26 | Discharge: 2023-11-26 | Disposition: A | Discharge status: Home / Self Care | Source: Ambulatory Visit | Attending: Obstetrics and Gynecology | Admitting: Obstetrics and Gynecology

## 2023-11-26 DIAGNOSIS — Z1231 Encounter for screening mammogram for malignant neoplasm of breast: Secondary | ICD-10-CM | POA: Diagnosis present

## 2024-03-17 ENCOUNTER — Other Ambulatory Visit: Payer: Self-pay | Admitting: Medical Genetics

## 2024-06-06 ENCOUNTER — Other Ambulatory Visit: Payer: Self-pay | Admitting: Neurosurgery

## 2024-06-06 DIAGNOSIS — Q2730 Arteriovenous malformation, site unspecified: Secondary | ICD-10-CM

## 2024-06-10 ENCOUNTER — Ambulatory Visit
Admission: RE | Admit: 2024-06-10 | Discharge: 2024-06-10 | Disposition: A | Discharge status: Home / Self Care | Source: Ambulatory Visit | Attending: Neurosurgery | Admitting: Neurosurgery

## 2024-06-10 DIAGNOSIS — Q2730 Arteriovenous malformation, site unspecified: Secondary | ICD-10-CM | POA: Insufficient documentation

## 2024-06-15 ENCOUNTER — Other Ambulatory Visit: Payer: Self-pay | Admitting: Medical Genetics

## 2024-06-15 DIAGNOSIS — Z006 Encounter for examination for normal comparison and control in clinical research program: Secondary | ICD-10-CM

## 2024-08-08 HISTORY — PX: BRAIN SURGERY: SHX531

## 2024-08-27 ENCOUNTER — Ambulatory Visit: Admission: EM | Admit: 2024-08-27 | Discharge: 2024-08-27 | Disposition: A | Discharge status: Home / Self Care

## 2024-08-27 ENCOUNTER — Ambulatory Visit (INDEPENDENT_AMBULATORY_CARE_PROVIDER_SITE_OTHER)

## 2024-08-27 DIAGNOSIS — M25562 Pain in left knee: Secondary | ICD-10-CM

## 2024-08-27 NOTE — Discharge Instructions (Addendum)
 Continue symptomatic treatment including rest, elevation, ice packs as needed.  Wear the knee sleeve as directed.  Follow-up with an orthopedist such as the one listed below.

## 2024-08-27 NOTE — ED Provider Notes (Signed)
 " Cheryl Roach    CSN: [id number] Arrival date & time: 08/27/24  9071      History   Chief Complaint Chief Complaint  Patient presents with   Knee Pain    HPI Cheryl Roach is a 64 y.o. female.  Patient presents with 3-day history of left knee pain.  No trauma or falls.  The knee pain is worse with movement, weightbearing, ambulation.  It improves with elevation and rest.  No wounds, rash, bruising, redness, swelling, numbness, weakness, paresthesias.  Treatment attempted with Tylenol and IcyHot.  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  She participates in physical therapy.  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  She had follow-up with [facility] neurosurgery on 08/24/2024.  The history is provided by the patient and medical records.    Past Medical History:  Diagnosis Date   Depression    Thyroid disease     There are no active problems to display for this patient.   Past Surgical History:  Procedure Laterality Date   BRAIN SURGERY  08/08/2024   anerysm/ AVM   BREAST EXCISIONAL BIOPSY Left 2006   neg    OB History   No obstetric history on file.      Home Medications    Prior to Admission medications  Medication Sig Start Date End Date Taking? Authorizing Provider  aspirin 81 MG chewable tablet Chew 81 mg by mouth. 08/15/24 08/15/25 Yes [provider]  levothyroxine (SYNTHROID, LEVOTHROID) 100 MCG tablet Take 100 mcg by mouth daily before breakfast.   Yes [provider]  Multiple Vitamins-Minerals (VISION PLUS PO) Take by mouth.   Yes [provider]  PARoxetine (PAXIL) 20 MG tablet Take 20 mg by mouth daily.   Yes [provider]  benzonatate  (TESSALON ) 100 MG capsule Take 1 capsule (100 mg total) by mouth 3 (three) times daily as needed. 08/17/22   Vivienne Delon HERO, PA-C  JUNEL  FE 1/20 1-20 MG-MCG tablet Take 1 tablet by mouth daily. 09/05/19   [provider]  ondansetron  (ZOFRAN -ODT) 4 MG disintegrating tablet Take 1 tablet (4 mg total) by mouth every 8 (eight) hours as needed for nausea or vomiting. 08/17/22   Vivienne Delon HERO, PA-C  terbinafine  (LAMISIL ) 250 MG tablet Take 1 tablet (250 mg total) by mouth daily. 09/28/19   Tobie Franky SQUIBB, DPM    Family History Family History  Problem Relation Age of Onset   Breast cancer Neg Hx     Social History Social History[1]   Allergies   Patient has no known allergies.   Review of Systems Review of Systems  Constitutional:  Negative for chills and fever.  Musculoskeletal:  Positive for arthralgias. Negative for joint swelling.  Skin:  Negative for color change, rash and wound.  Neurological:  Negative for weakness and numbness.     Physical Exam Triage Vital Signs ED Triage Vitals  Encounter Vitals Group     BP 08/27/24 1014 122/86     Girls Systolic BP Percentile --      Girls Diastolic BP Percentile --      Boys Systolic BP Percentile --      Boys Diastolic BP Percentile --      Pulse Rate 08/27/24 1014 86     Resp 08/27/24 1014 20     Temp 08/27/24 1014 98.1 F (36.7 C)  Temp src --      SpO2 08/27/24 1014 98 %     Weight --      Height --      Head Circumference --      Peak Flow --      Pain Score 08/27/24 1013 8     Pain Loc --      Pain Education --      Exclude from Growth Chart --    No data found.  Updated Vital Signs BP 122/86   Pulse 86   Temp 98.1 F (36.7 C)   Resp 20   SpO2 98%   Visual Acuity Right Eye Distance:   Left Eye Distance:   Bilateral Distance:    Right Eye Near:   Left Eye Near:    Bilateral Near:     Physical Exam Constitutional:      General: She is not in acute distress. HENT:     Mouth/Throat:     Mouth: Mucous membranes are moist.  Cardiovascular:     Rate and Rhythm: Normal rate.  Pulmonary:     Effort: Pulmonary effort  is normal. No respiratory distress.  Musculoskeletal:        General: No swelling, tenderness or deformity. Normal range of motion.  Skin:    Capillary Refill: Capillary refill takes less than 2 seconds.     Findings: No bruising, erythema, lesion or rash.  Neurological:     General: No focal deficit present.     Mental Status: She is alert.     Sensory: No sensory deficit.     Motor: No weakness.     Gait: Gait abnormal.     Comments: Ambulatory with walker.      UC Treatments / Results  Labs (all labs ordered are listed, but only abnormal results are displayed) Labs Reviewed - No data to display  EKG   Radiology DG Knee Complete 4 Views Left Result Date: 08/27/2024 CLINICAL DATA:  Left knee pain. EXAM: LEFT KNEE - COMPLETE 4+ VIEW COMPARISON:  None Available. FINDINGS: No evidence of acute fracture or dislocation. No evidence of arthropathy or other focal bone abnormality. A small suprapatellar effusion is suspected. IMPRESSION: 1. No acute osseous abnormality. 2. Suspected small suprapatellar effusion. Electronically Signed   By: Suzen Dials M.D.   On: 08/27/2024 11:32    Procedures Procedures (including critical care time)  Medications Ordered in UC Medications - No data to display  Initial Impression / Assessment and Plan / UC Course  I have reviewed the triage vital signs and the nursing notes.  Pertinent labs & imaging results that were available during my care of the patient were reviewed by me and considered in my medical decision making (see chart for details).   Left knee pain.  No falls or trauma.  Afebrile and vital signs are stable.  Xray shows no acute bony abnormality.  Treating today with knee sleeve, rest, elevation, ice packs.  (No NSAIDs recommended today as patient recently had a ruptured brain aneurysm.)  Instructed her to follow-up with an orthopedist.  Contact information for on-call Ortho provided.  Education provided on acute knee pain.   Patient agrees to plan of care. Final Clinical Impressions(s) / UC Diagnoses   Final diagnoses:  Acute pain of left knee     Discharge Instructions      Continue symptomatic treatment including rest, elevation, ice packs as needed.  Wear the knee sleeve as directed.  Follow-up with an orthopedist  such as the one listed below.     ED Prescriptions   None    PDMP not reviewed this encounter.     [1]  Social History Tobacco Use   Smoking status: Never  Vaping Use   Vaping status: Never Used  Substance Use Topics   Alcohol use: Not Currently   Drug use: Never     Corlis Burnard DEL, NP 08/27/24 1137  "

## 2024-08-27 NOTE — ED Triage Notes (Signed)
 Patient to Urgent Care with complaints of left sided knee pain. Unsure of any injury. Started Wednesday. Reports she has been using a walker since September due to a separate issue.  Using icy-hot and tylenol.

## 2024-08-29 ENCOUNTER — Ambulatory Visit (HOSPITAL_COMMUNITY): Payer: Self-pay
